# Patient Record
Sex: Female | Born: 1980 | Race: Black or African American | Hispanic: No | Marital: Single | Smoking: Never smoker
Health system: Southern US, Community
[De-identification: ages and names within clinical notes are randomized; demographics above are authoritative.]

## PROBLEM LIST (undated history)

## (undated) DIAGNOSIS — R911 Solitary pulmonary nodule: Secondary | ICD-10-CM

## (undated) HISTORY — DX: Solitary pulmonary nodule: R91.1

## (undated) HISTORY — PX: LIPOSUCTION: SHX10

---

## 2013-10-17 ENCOUNTER — Ambulatory Visit
Admission: RE | Admit: 2013-10-17 | Discharge: 2013-10-17 | Disposition: A | Discharge status: Home / Self Care | Payer: 59 | Source: Ambulatory Visit | Attending: Emergency Medicine | Admitting: Emergency Medicine

## 2013-10-17 ENCOUNTER — Other Ambulatory Visit: Payer: Self-pay | Admitting: Emergency Medicine

## 2013-10-17 DIAGNOSIS — R911 Solitary pulmonary nodule: Secondary | ICD-10-CM

## 2014-05-01 ENCOUNTER — Other Ambulatory Visit: Payer: Self-pay | Admitting: Family Medicine

## 2014-05-01 DIAGNOSIS — N6029 Fibroadenosis of unspecified breast: Secondary | ICD-10-CM

## 2014-05-07 ENCOUNTER — Encounter (INDEPENDENT_AMBULATORY_CARE_PROVIDER_SITE_OTHER): Payer: Self-pay

## 2014-05-07 ENCOUNTER — Ambulatory Visit
Admission: RE | Admit: 2014-05-07 | Discharge: 2014-05-07 | Disposition: A | Discharge status: Home / Self Care | Payer: 59 | Source: Ambulatory Visit | Attending: Family Medicine | Admitting: Family Medicine

## 2014-05-07 ENCOUNTER — Other Ambulatory Visit: Payer: Self-pay | Admitting: Family Medicine

## 2014-05-07 DIAGNOSIS — N6029 Fibroadenosis of unspecified breast: Secondary | ICD-10-CM

## 2014-06-01 ENCOUNTER — Encounter: Payer: Self-pay | Admitting: Nurse Practitioner

## 2014-06-12 ENCOUNTER — Ambulatory Visit (INDEPENDENT_AMBULATORY_CARE_PROVIDER_SITE_OTHER): Payer: 59 | Admitting: Nurse Practitioner

## 2014-06-12 ENCOUNTER — Encounter: Payer: Self-pay | Admitting: Nurse Practitioner

## 2014-06-12 VITALS — BP 110/74 | HR 80 | Ht 63.5 in | Wt 212.4 lb

## 2014-06-12 DIAGNOSIS — K219 Gastro-esophageal reflux disease without esophagitis: Secondary | ICD-10-CM

## 2014-06-12 NOTE — Patient Instructions (Signed)
We have given you literature for Gastroreflux disease or GERD.  Call us if your symptoms worsen at [phone]. You may ask for [facility] [employer].

## 2014-06-12 NOTE — Progress Notes (Signed)
    HPI :  Patient is a 33 year old female, new to this practice, here for evaluation of GERD. She was referred by Dr. Dorothyann Gibbs, Associate Medical Director at [facility] where patient is employed.. Patient had abdominoplasty with liposuction May 2014. A few months later she began having acid reflux symptoms. The symptoms resolved with exercise/weight loss but now having recurrent burning in her throat/belching. Of note, patient has recently regained 30 pounds. No nausea, abdominal pain, bowel changes or blood in stool. No solid food dysphasia, she occasionally regurgitates liquids.  Patient is actually feeling better. She is now trying to sleep with the head of her bed elevated and has made dietary changes including reductions in caffeine, acidic /spicy foods.  She plans to resume exercise in an effort to lose weight.   Past Medical History  Diagnosis Date  . Lung nodule     right    Family History  Problem Relation Age of Onset  . Breast cancer Mother   . Breast cancer Maternal Aunt   . Breast cancer Paternal Grandmother   . Colon cancer Maternal Grandfather   . Hypertension Mother     entire family   History  Substance Use Topics  . Smoking status: Never Smoker   . Smokeless tobacco: Never Used  . Alcohol Use: Yes     Comment: 12 per week - social on the weekends   Current Outpatient Prescriptions  Medication Sig Dispense Refill  . ibuprofen (ADVIL,MOTRIN) 600 MG tablet Take 600 mg by mouth every 8 (eight) hours as needed.       No current facility-administered medications for this visit.   Allergies  Allergen Reactions  . Penicillins Hives and Swelling  . Sulfa Antibiotics    Review of Systems: Positive for allergies, sinus problem, back pain, breast changes, fatigue and shortness of breath. All other systems reviewed and negative except where noted in HPI.   Physical Exam: BP 110/74  Pulse 80  Ht 5' 3.5" (1.613 m)  Wt 212 lb 6 oz (96.333 kg)  BMI 37.03 kg/m2  LMP  05/29/2014 Constitutional: Pleasant,well-developed, black female in no acute distress. HEENT: Normocephalic and atraumatic. Conjunctivae are normal. No scleral icterus. Neck supple.  Cardiovascular: Normal rate, regular rhythm.  Pulmonary/chest: Effort normal and breath sounds normal. No wheezing, rales or rhonchi. Abdominal: Soft, nondistended, nontender. Bowel sounds active throughout. There are no masses palpable. No hepatomegaly. Extremities: no edema Lymphadenopathy: No cervical adenopathy noted. Neurological: Alert and oriented to person place and time. Skin: Skin is warm and dry. No rashes noted. Psychiatric: Normal mood and affect. Behavior is normal.   ASSESSMENT AND PLAN:   33 year old female with episodic GERD symptoms mainly influence by dietary choices and weight. Historically symptoms have improved with lifestyle changes, in fact she has already improved with recent changes. Patient has recently gained 30 pounds which she plans to start working on. At this point patient is agreeable to continue lifestyle / dietary changes. She has already starting sleeping with HOB elevated. Patient will call if GERD symptoms recur. GERD literature given.

## 2014-06-13 NOTE — Progress Notes (Signed)
Reviewed, needs to decrease Ibuprofen, also should she have a PPI to take daily or prn ? ( OTC is OK)

## 2015-10-09 ENCOUNTER — Other Ambulatory Visit: Payer: Self-pay | Admitting: Internal Medicine

## 2015-10-09 DIAGNOSIS — R911 Solitary pulmonary nodule: Secondary | ICD-10-CM

## 2015-11-04 ENCOUNTER — Ambulatory Visit
Admission: RE | Admit: 2015-11-04 | Discharge: 2015-11-04 | Disposition: A | Discharge status: Home / Self Care | Payer: Managed Care, Other (non HMO) | Source: Ambulatory Visit | Attending: Internal Medicine | Admitting: Internal Medicine

## 2015-11-04 DIAGNOSIS — R911 Solitary pulmonary nodule: Secondary | ICD-10-CM

## 2016-11-10 IMAGING — CT CT CHEST W/O CM
2 of 5 series · 15 of 36 positions shown, 18 images · non-contrast
Comparison: No available priors for comparison.

CLINICAL DATA: Pulmonary nodule.

EXAM:
CT CHEST WITHOUT CONTRAST
TECHNIQUE: Multidetector CT imaging of the chest was performed following the
standard protocol without IV contrast.

[Series 3: cor · coronal · 0.54mm/px · 3 of 80 slices shown]
[im 16/80  lung]
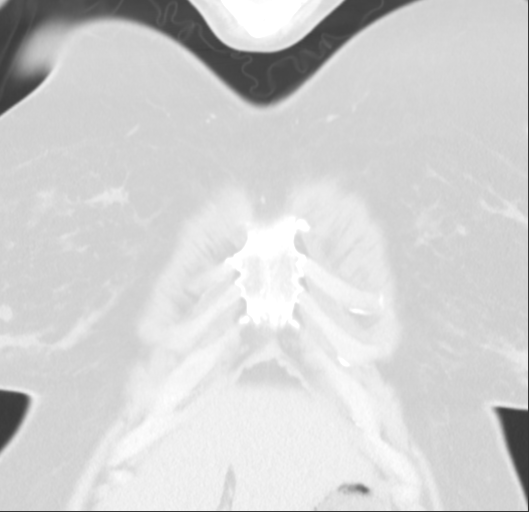
[im 32/80  lung]
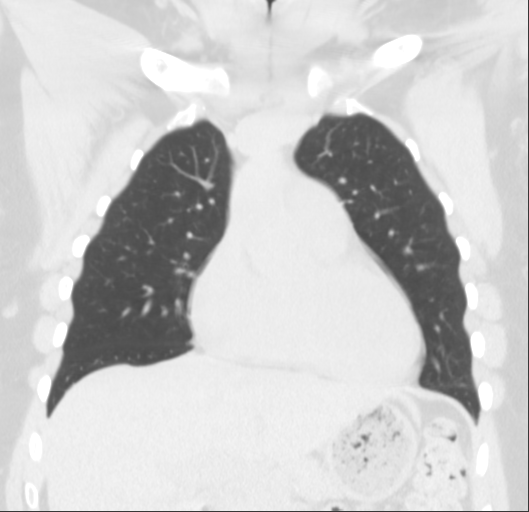
[im 48/80  lung]
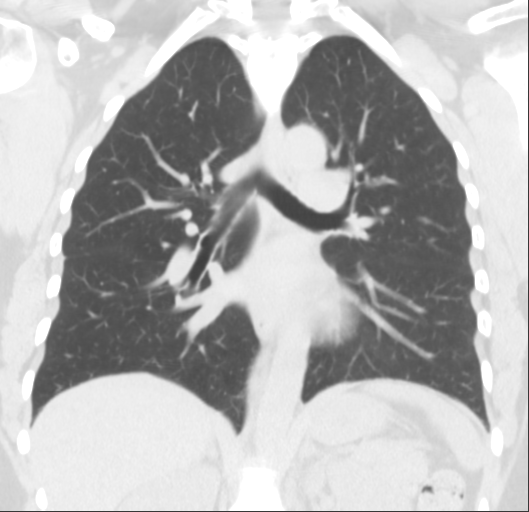

[Series 6: super d · axial · 0.57mm/px · z∈[-153,+98]mm · 12 of 281 slices shown, 15 images]
[im 15/281  mediastinal]
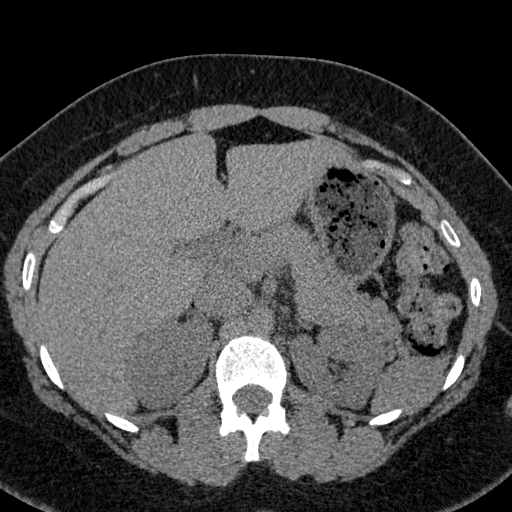
[im 15/281  lung]
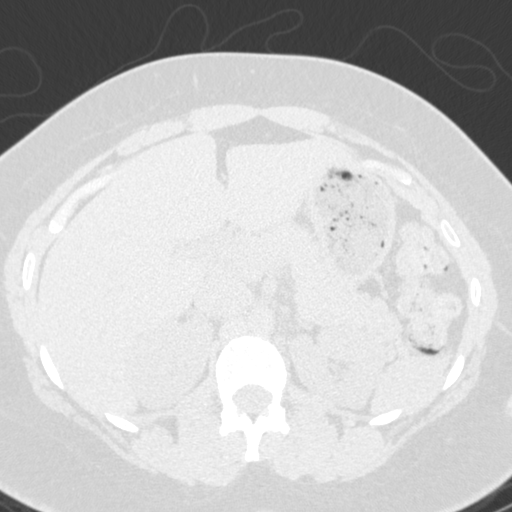
[im 45/281  lung]
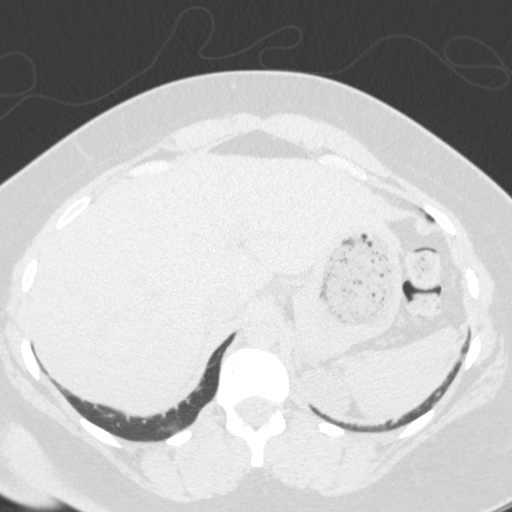
[im 59/281  lung]
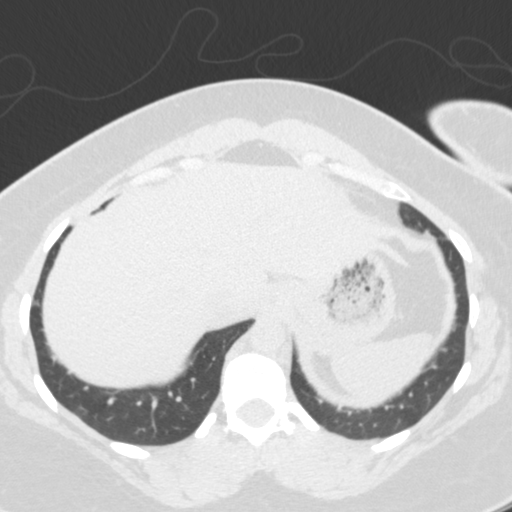
[im 89/281  lung]
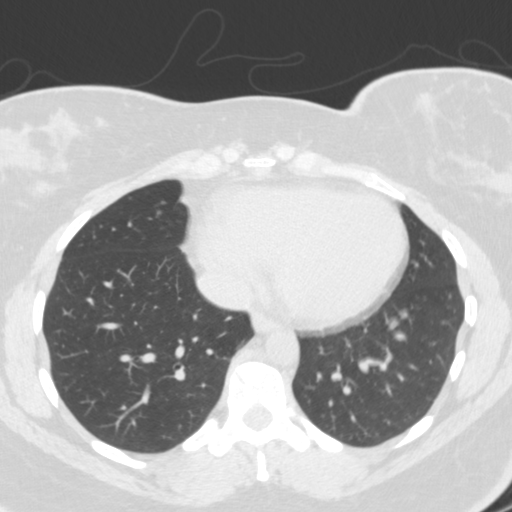
[im 104/281  mediastinal]
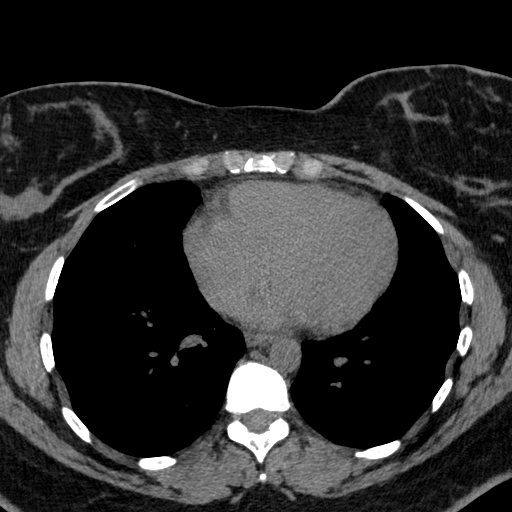
[im 104/281  lung]
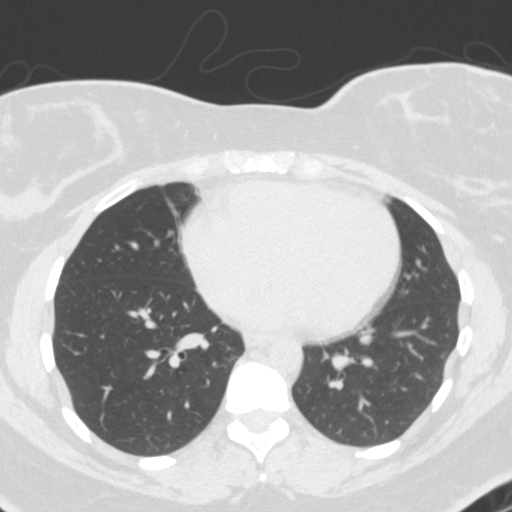
[im 133/281  lung]
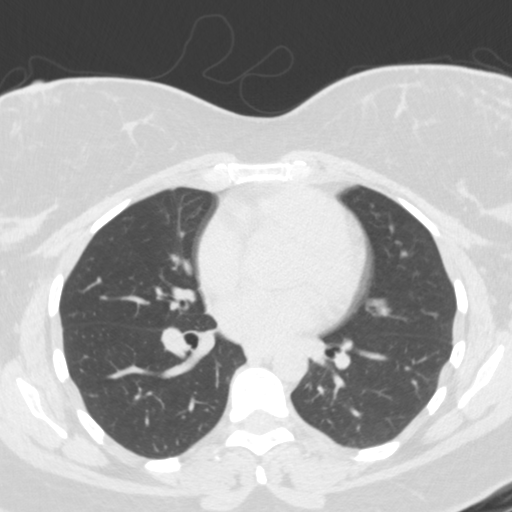
[im 148/281  lung]
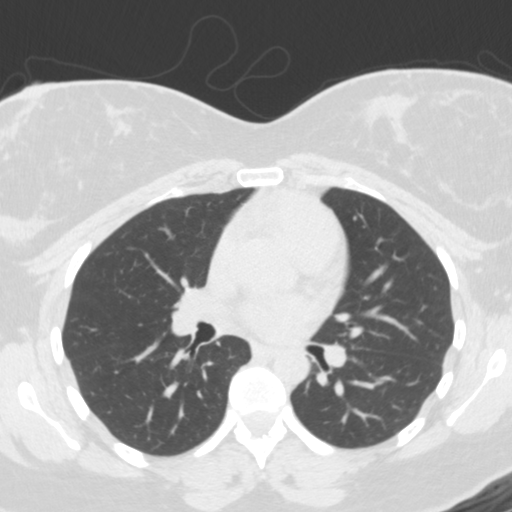
[im 177/281  lung]
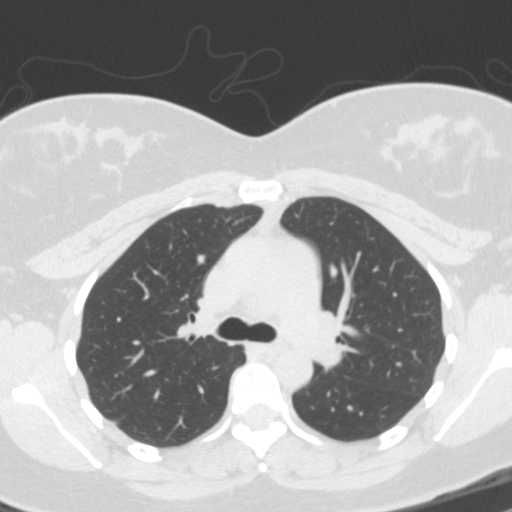
[im 192/281  mediastinal]
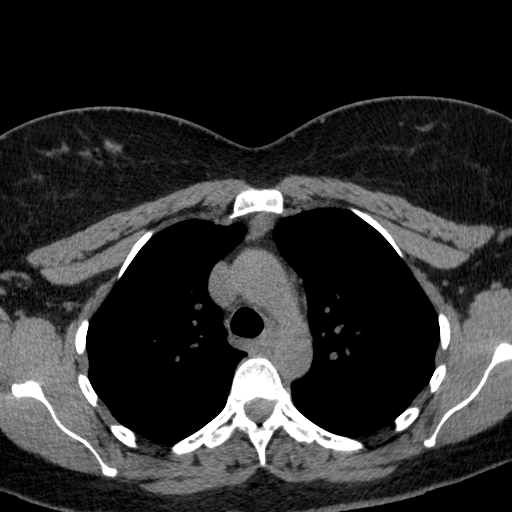
[im 192/281  lung]
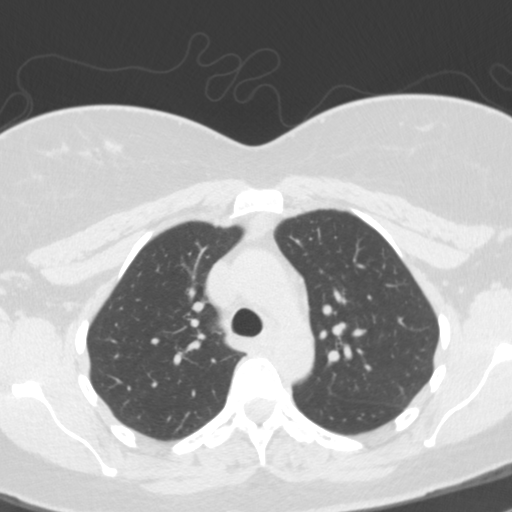
[im 222/281  lung]
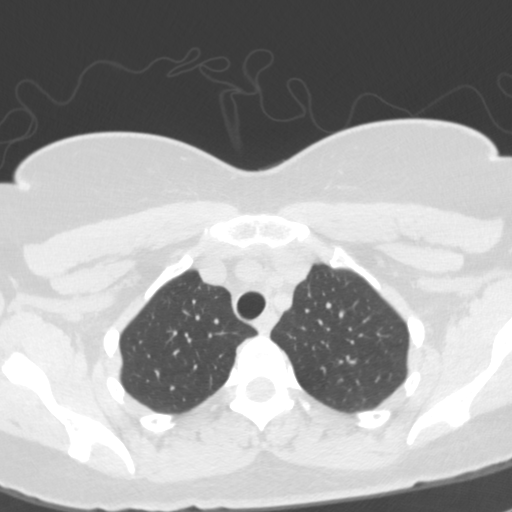
[im 236/281  lung]
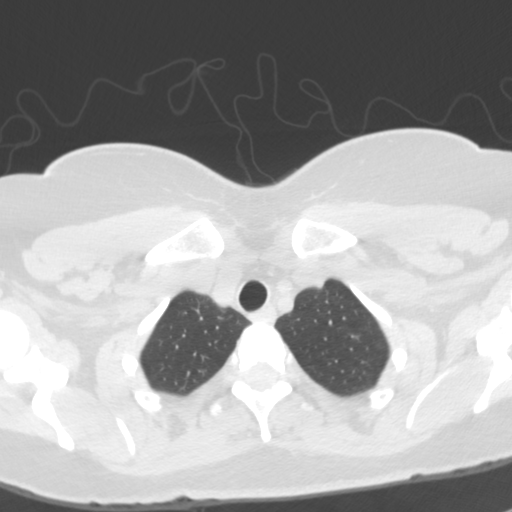
[im 266/281  lung]
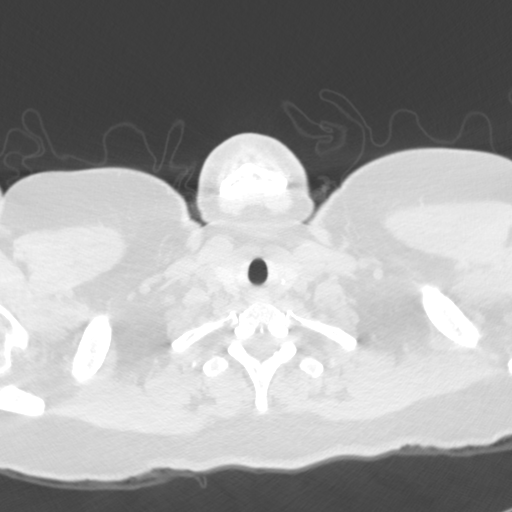

[15 of 36 positions shown; findings below may reference images not displayed]

FINDINGS: Mediastinum/Nodes: Probable residual thymic tissue in the
prevascular space. No pathologically enlarged mediastinal or
axillary lymph nodes. Hilar regions are difficult to definitively
evaluate without IV contrast appear grossly unremarkable. Heart size
normal. No pericardial or pleural effusion.

Lungs/Pleura: No worrisome pulmonary nodules. No pleural fluid.
Airway is unremarkable.

Upper abdomen: Visualized portions of the liver, adrenal glands,
kidneys, spleen, pancreas and stomach are grossly unremarkable.

Musculoskeletal: No worrisome lytic or sclerotic lesions.
IMPRESSION: Normal exam.

## 2017-08-28 ENCOUNTER — Other Ambulatory Visit: Payer: Self-pay

## 2017-08-28 ENCOUNTER — Emergency Department (HOSPITAL_BASED_OUTPATIENT_CLINIC_OR_DEPARTMENT_OTHER)
Admission: EM | Admit: 2017-08-28 | Discharge: 2017-08-28 | Disposition: A | Discharge status: Home / Self Care | Payer: Commercial Managed Care - PPO | Attending: Emergency Medicine | Admitting: Emergency Medicine

## 2017-08-28 ENCOUNTER — Encounter (HOSPITAL_BASED_OUTPATIENT_CLINIC_OR_DEPARTMENT_OTHER): Payer: Self-pay | Admitting: Emergency Medicine

## 2017-08-28 DIAGNOSIS — Y998 Other external cause status: Secondary | ICD-10-CM | POA: Insufficient documentation

## 2017-08-28 DIAGNOSIS — S199XXA Unspecified injury of neck, initial encounter: Secondary | ICD-10-CM | POA: Diagnosis present

## 2017-08-28 DIAGNOSIS — X501XXA Overexertion from prolonged static or awkward postures, initial encounter: Secondary | ICD-10-CM | POA: Insufficient documentation

## 2017-08-28 DIAGNOSIS — Y9389 Activity, other specified: Secondary | ICD-10-CM | POA: Diagnosis not present

## 2017-08-28 DIAGNOSIS — Y929 Unspecified place or not applicable: Secondary | ICD-10-CM | POA: Diagnosis not present

## 2017-08-28 DIAGNOSIS — S161XXA Strain of muscle, fascia and tendon at neck level, initial encounter: Secondary | ICD-10-CM

## 2017-08-28 MED ORDER — CYCLOBENZAPRINE HCL 10 MG PO TABS: 10.0000 mg | ORAL_TABLET | Freq: Two times a day (BID) | ORAL | 0 refills | Status: AC | PRN

## 2017-08-28 MED ORDER — IBUPROFEN 600 MG PO TABS: 600.0000 mg | ORAL_TABLET | Freq: Four times a day (QID) | ORAL | 0 refills | Status: AC | PRN

## 2017-08-28 NOTE — ED Triage Notes (Addendum)
Reports pain to right side of head, neck, ear and shoulder.  States this began after having deep tissue massages on Tuesday and Wednesday for this week.  States pain is worse in the neck than anywhere else.

## 2017-08-28 NOTE — ED Notes (Signed)
Pt given d/c instructions as per chart. Rx x 2. Verbalizes understanding. No questions. 

## 2017-08-28 NOTE — ED Provider Notes (Signed)
[facility] EMERGENCY DEPARTMENT Provider Note   CSN: [id number] Arrival date & time: 08/28/17  1433     History   Chief Complaint Chief Complaint  Patient presents with  . Neck Pain    HPI Heather Michael is a 36 y.o. female.  Patient is a 36 year old female who presents with right neck pain.  She states that started about a week ago after she went on a long car ride for Thanksgiving.  She states it started as a soreness in her right neck.  She had 2 deep tissue massages during the week that made her symptoms worse.  Now she feels a lot of tightness to her neck that radiates up to her right scalp and down to her right shoulder.  She denies any further radiation down her arm.  No numbness or weakness in her arm.  No recent trauma.  No facial numbness.  No vision changes.  No dizziness or ataxia.  She has had some problems with muscle spasms in her neck before but not typically this bad.  She took 1 dose ibuprofen without improvement of symptoms.      Past Medical History:  Diagnosis Date  . Lung nodule    right    Patient Active Problem List   Diagnosis Date Noted  . GERD (gastroesophageal reflux disease) 06/12/2014    Past Surgical History:  Procedure Laterality Date  . LIPOSUCTION     and tummy tuck    OB History    No data available       Home Medications    Prior to Admission medications   Medication Sig Start Date End Date Taking? Authorizing Provider  cyclobenzaprine (FLEXERIL) 10 MG tablet Take 1 tablet (10 mg total) by mouth 2 (two) times daily as needed for muscle spasms. 08/28/17   Rolan BuccoBelfi, Dejohn Ibarra, MD  ibuprofen (ADVIL,MOTRIN) 600 MG tablet Take 1 tablet (600 mg total) by mouth every 6 (six) hours as needed. 08/28/17   Rolan BuccoBelfi, Ellyanna Holton, MD    Family History Family History  Problem Relation Age of Onset  . Breast cancer Mother   . Hypertension Mother        entire family  . Breast cancer Maternal Aunt   . Breast cancer Paternal  Grandmother   . Colon cancer Maternal Grandfather     Social History Social History   Tobacco Use  . Smoking status: Never Smoker  . Smokeless tobacco: Never Used  Substance Use Topics  . Alcohol use: Yes  . Drug use: No     Allergies   Penicillins and Sulfa antibiotics   Review of Systems Review of Systems  Constitutional: Negative for fever.  Eyes: Negative for visual disturbance.  Gastrointestinal: Negative for nausea and vomiting.  Musculoskeletal: Positive for neck pain. Negative for arthralgias, back pain and joint swelling.  Skin: Negative for wound.  Neurological: Negative for dizziness, facial asymmetry, speech difficulty, weakness, numbness and headaches.     Physical Exam Updated Vital Signs BP 138/72 (BP Location: Left Arm)   Pulse 94   Temp 98.1 F (36.7 C) (Oral)   Resp 20   LMP 08/13/2017 (Approximate)   SpO2 100%   Physical Exam  Constitutional: She is oriented to person, place, and time. She appears well-developed and well-nourished.  HENT:  Head: Normocephalic and atraumatic.  Right Ear: External ear normal.  Eyes: Conjunctivae and EOM are normal.  Neck: Normal range of motion. Neck supple.  Positive tenderness along the right cervical paraspinal muscles in the  right trapezius muscles.  There is no pain over the TMJ.  No pain over the temporal artery.  No swelling noted.  No pain on range of motion of the shoulder.  Cardiovascular: Normal rate.  Pulmonary/Chest: Effort normal.  Musculoskeletal: She exhibits no edema or tenderness.  No pain on range of motion of the shoulder or elbow.  Radial pulses are intact.  Neurological: She is alert and oriented to person, place, and time.  Motor 5/5 all extremities Sensation grossly intact to LT all extremities no pronator drift CN II-XII grossly intact    Skin: Skin is warm and dry.  Psychiatric: She has a normal mood and affect.     ED Treatments / Results  Labs (all labs ordered are listed,  but only abnormal results are displayed) Labs Reviewed - No data to display  EKG  EKG Interpretation None       Radiology No results found.  Procedures Procedures (including critical care time)  Medications Ordered in ED Medications - No data to display   Initial Impression / Assessment and Plan / ED Course  I have reviewed the triage vital signs and the nursing notes.  Pertinent labs & imaging results that were available during my care of the patient were reviewed by me and considered in my medical decision making (see chart for details).     Patient has pain to the right cervical muscles.  It is worse with movement and palpation.  There is no neurologic deficits.  No more anterior neck pain which we were concerning for carotid artery dissection.  No bony tenderness.  This is likely muscular in nature.  She was started on ibuprofen and Flexeril.  She was encouraged to follow-up with her PCP with [facility].  Return precautions were given.  Final Clinical Impressions(s) / ED Diagnoses   Final diagnoses:  Acute strain of neck muscle, initial encounter    ED Discharge Orders        Ordered    cyclobenzaprine (FLEXERIL) 10 MG tablet  2 times daily PRN     08/28/17 1516    ibuprofen (ADVIL,MOTRIN) 600 MG tablet  Every 6 hours PRN     08/28/17 1516       Rolan BuccoBelfi, Kassidy Dockendorf, MD 08/28/17 1521

## 2017-08-28 NOTE — ED Notes (Signed)
ED Provider at bedside. 

## 2017-08-28 NOTE — ED Notes (Signed)
Pt is able to turn head R/L without any difficulty.  Pt also stated that when her neck starts to hurt, her right eye starts to get blurry.

## 2017-10-12 ENCOUNTER — Emergency Department (HOSPITAL_BASED_OUTPATIENT_CLINIC_OR_DEPARTMENT_OTHER)
Admission: EM | Admit: 2017-10-12 | Discharge: 2017-10-12 | Disposition: A | Discharge status: Home / Self Care | Payer: Commercial Managed Care - PPO | Attending: Emergency Medicine | Admitting: Emergency Medicine

## 2017-10-12 ENCOUNTER — Other Ambulatory Visit: Payer: Self-pay

## 2017-10-12 ENCOUNTER — Encounter (HOSPITAL_BASED_OUTPATIENT_CLINIC_OR_DEPARTMENT_OTHER): Payer: Self-pay | Admitting: *Deleted

## 2017-10-12 DIAGNOSIS — R519 Headache, unspecified: Secondary | ICD-10-CM

## 2017-10-12 DIAGNOSIS — Z79899 Other long term (current) drug therapy: Secondary | ICD-10-CM | POA: Insufficient documentation

## 2017-10-12 DIAGNOSIS — R0600 Dyspnea, unspecified: Secondary | ICD-10-CM | POA: Diagnosis not present

## 2017-10-12 DIAGNOSIS — R51 Headache: Secondary | ICD-10-CM | POA: Insufficient documentation

## 2017-10-12 NOTE — ED Provider Notes (Signed)
[facility] EMERGENCY DEPARTMENT Provider Note   CSN: [id number] Arrival date & time: 10/12/17  1828     History   Chief Complaint Chief Complaint  Patient presents with  . Allergic Reaction    HPI Heather Michael is a 37 y.o. female. Patient presents for evaluation of episodes in her kitchen.  HPI:  37 year old female. States she had a cleaning service come to her house and they cleaned her kitchen 2 weeks ago. She states every time that she enters her kitchen she feels as though she gets headache. Occasionally nausea. A cough and tightness in her chest. Admits there is some anxiety component to it but feels as though her symptoms start with physical symptoms.  Has electric stove, but a gas furnace. No difficulty with difference that she is aware of. No unusual odors in the home. No pets or other occupants of the house and she owns her home for the last 3 years.  Or difficulty of molds. No history of allergic reactions. No other new environment, chemicals, etc. in her world. No rash, hives, itching.  Past Medical History:  Diagnosis Date  . Lung nodule    right    Patient Active Problem List   Diagnosis Date Noted  . GERD (gastroesophageal reflux disease) 06/12/2014    Past Surgical History:  Procedure Laterality Date  . LIPOSUCTION     and tummy tuck    OB History    No data available       Home Medications    Prior to Admission medications   Medication Sig Start Date End Date Taking? Authorizing Provider  cyclobenzaprine (FLEXERIL) 10 MG tablet Take 1 tablet (10 mg total) by mouth 2 (two) times daily as needed for muscle spasms. 08/28/17   Rolan BuccoBelfi, Melanie, MD  ibuprofen (ADVIL,MOTRIN) 600 MG tablet Take 1 tablet (600 mg total) by mouth every 6 (six) hours as needed. 08/28/17   Rolan BuccoBelfi, Melanie, MD    Family History Family History  Problem Relation Age of Onset  . Breast cancer Mother   . Hypertension Mother        entire family  . Breast cancer  Maternal Aunt   . Breast cancer Paternal Grandmother   . Colon cancer Maternal Grandfather     Social History Social History   Tobacco Use  . Smoking status: Never Smoker  . Smokeless tobacco: Never Used  Substance Use Topics  . Alcohol use: Yes  . Drug use: No     Allergies   Penicillins and Sulfa antibiotics   Review of Systems Review of Systems  Constitutional: Negative for appetite change, chills, diaphoresis, fatigue and fever.  HENT: Negative for mouth sores, sore throat and trouble swallowing.   Eyes: Negative for visual disturbance.  Respiratory: Positive for chest tightness. Negative for cough, shortness of breath and wheezing.   Cardiovascular: Negative for chest pain.  Gastrointestinal: Negative for abdominal distention, abdominal pain, diarrhea, nausea and vomiting.  Endocrine: Negative for polydipsia, polyphagia and polyuria.  Genitourinary: Negative for dysuria, frequency and hematuria.  Musculoskeletal: Negative for gait problem.  Skin: Negative for color change, pallor and rash.  Neurological: Positive for headaches. Negative for dizziness, syncope and light-headedness.  Hematological: Does not bruise/bleed easily.  Psychiatric/Behavioral: Negative for behavioral problems and confusion.     Physical Exam Updated Vital Signs BP (!) 149/84   Pulse 90   Temp 98.1 F (36.7 C) (Oral)   Resp 18   Ht 5' 3.5" (1.613 m)   Wt 102.5  kg (226 lb)   LMP 10/05/2017   SpO2 100%   BMI 39.41 kg/m   Physical Exam  Constitutional: She is oriented to person, place, and time. She appears well-developed and well-nourished. No distress.  HENT:  Head: Normocephalic.  Eyes: Conjunctivae are normal. Pupils are equal, round, and reactive to light. No scleral icterus.  Neck: Normal range of motion. Neck supple. No thyromegaly present.  Cardiovascular: Normal rate and regular rhythm. Exam reveals no gallop and no friction rub.  No murmur heard. Pulmonary/Chest: Effort  normal and breath sounds normal. No respiratory distress. She has no wheezes. She has no rales.  Abdominal: Soft. Bowel sounds are normal. She exhibits no distension. There is no tenderness. There is no rebound.  Musculoskeletal: Normal range of motion.  Neurological: She is alert and oriented to person, place, and time.  Skin: Skin is warm and dry. No rash noted.  Psychiatric: She has a normal mood and affect. Her behavior is normal.     ED Treatments / Results  Labs (all labs ordered are listed, but only abnormal results are displayed) Labs Reviewed - No data to display  EKG  EKG Interpretation None       Radiology No results found.  Procedures Procedures (including critical care time)  Medications Ordered in ED Medications - No data to display   Initial Impression / Assessment and Plan / ED Course  I have reviewed the triage vital signs and the nursing notes.  Pertinent labs & imaging results that were available during my care of the patient were reviewed by me and considered in my medical decision making (see chart for details).    Cooximeter shows seal level "less than 6". She is essentially symptomatically now. Discussed with her that episodes happening in her kitchen could be anxiety related, could be allergic if dust or mold were present. I discussed with her her carbon monoxide level. We cannot be more specific with available testing tonight other than less than 6. She is a nonsmoker. 6 would be high, 2 would be normal. I have asked her to return home. Take a daily antihistamine such as Zyrtec. [place]stra contact the fire department tonight and have them check her home for carbon monoxide. She does not know she has a monitor at home. If negative. Continue daily antihistamine. If not improving primary care follow-up.  Final Clinical Impressions(s) / ED Diagnoses   Final diagnoses:  Dyspnea, unspecified type  Nonintractable headache, unspecified chronicity pattern,  unspecified headache type    ED Discharge Orders    None       Rolland Porter, MD 10/12/17 1942

## 2017-10-12 NOTE — Discharge Instructions (Signed)
Zyrtec 10 mg once per day for possible allergy to household dust molds etc.  Contact fire department tonight for home carbon monoxide testing.

## 2017-10-12 NOTE — ED Triage Notes (Signed)
States every since her oven was cleaned with oven cleaner she has had chills, headache and SOB when she goes into the kitchen. No distress at triage.
